# Patient Record
Sex: Male | Born: 1970 | Race: Asian | Hispanic: No | Marital: Single | Smoking: Never smoker
Health system: Southern US, Community
[De-identification: ages and names within clinical notes are randomized; demographics above are authoritative.]

---

## 1998-07-29 HISTORY — PX: RETINAL LASER PROCEDURE: SHX2339

## 2011-03-30 ENCOUNTER — Ambulatory Visit: Payer: Self-pay | Admitting: Internal Medicine

## 2013-06-26 ENCOUNTER — Ambulatory Visit: Payer: Self-pay

## 2014-06-21 ENCOUNTER — Ambulatory Visit: Payer: Self-pay

## 2016-07-29 HISTORY — PX: CATARACT EXTRACTION: SUR2

## 2019-06-21 ENCOUNTER — Ambulatory Visit (INDEPENDENT_AMBULATORY_CARE_PROVIDER_SITE_OTHER): Payer: 59 | Admitting: Family Medicine

## 2019-06-21 ENCOUNTER — Encounter: Payer: Self-pay | Admitting: Family Medicine

## 2019-06-21 ENCOUNTER — Other Ambulatory Visit: Payer: Self-pay

## 2019-06-21 VITALS — BP 137/86 | HR 78 | Temp 98.5°F | Resp 16 | Ht 66.0 in | Wt 182.0 lb

## 2019-06-21 DIAGNOSIS — Z0000 Encounter for general adult medical examination without abnormal findings: Secondary | ICD-10-CM | POA: Diagnosis not present

## 2019-06-21 DIAGNOSIS — Z125 Encounter for screening for malignant neoplasm of prostate: Secondary | ICD-10-CM

## 2019-06-21 DIAGNOSIS — Z7689 Persons encountering health services in other specified circumstances: Secondary | ICD-10-CM | POA: Diagnosis not present

## 2019-06-21 DIAGNOSIS — E663 Overweight: Secondary | ICD-10-CM

## 2019-06-21 DIAGNOSIS — R030 Elevated blood-pressure reading, without diagnosis of hypertension: Secondary | ICD-10-CM | POA: Insufficient documentation

## 2019-06-21 DIAGNOSIS — Z1211 Encounter for screening for malignant neoplasm of colon: Secondary | ICD-10-CM

## 2019-06-21 DIAGNOSIS — E66811 Obesity, class 1: Secondary | ICD-10-CM | POA: Insufficient documentation

## 2019-06-21 NOTE — Patient Instructions (Addendum)
Thank you for coming to the office today.  Labs today - stay tuned for results.  For Lab Results, once available within 2-3 days of blood draw, you can can log in to MyChart online to view your results and a brief explanation. Also, we can discuss results at next follow-up visit.  Colon Cancer Screening: - For all adults age 46+ routine colon cancer screening is highly recommended.     - Recent guidelines from [place] recommend starting age of 48 - Early detection of colon cancer is important, because often there are no warning signs or symptoms, also if found early usually it can be cured. Late stage is hard to treat.  - If you are not interested in Colonoscopy screening (if done and normal you could be cleared for 5 to 10 years until next due), then Cologuard is an excellent alternative for screening test for Colon Cancer. It is highly sensitive for detecting DNA of colon cancer from even the earliest stages. Also, there is NO bowel prep required. - If Cologuard is NEGATIVE, then it is good for 3 years before next due - If Cologuard is POSITIVE, then it is strongly advised to get a Colonoscopy, which allows the GI doctor to locate the source of the cancer or polyp (even very early stage) and treat it by removing it. ------------------------- If you would like to proceed with Cologuard (stool DNA test) Follow instructions to collect sample, you may call the company for any help or questions, 24/7 telephone support at [phone].   Please schedule a Follow-up Appointment to: Return in about 1 year (around 06/20/2020) for Annual Physical.  If you have any other questions or concerns, please feel free to call the office or send a message through [place]. You may also schedule an earlier appointment if necessary.  Additionally, you may be receiving a survey about your experience at our office within a few days to 1 week by e-mail or mail. We value your feedback.  Nobie Putnam, DO [place]

## 2019-06-21 NOTE — Progress Notes (Signed)
Subjective:    Patient ID: Maxwell Cooper, male    DOB: February 28, 1971, 48 y.o.   MRN: [id number]  Maxwell Cooper is a 48 y.o. male presenting on 06/21/2019 for Annual Exam (establish care as well today)  Establishing with new doctor now for routine blood work and physical.  HPI   Here for Annual Physical and Lab Review.  Overweight BMI >29 He is doing well overall No new significant medical concerns. No significant medical history in past related to general health. He has not had elevated cholesterol, blood sugar. He has had occasional mild elevated BP but never dx with HTN.  Additional complaint Admits episodic rare R hip pain episodic- with some possible pinched nerve. No new injury, seems to be episodic only  Health Maintenance: Colon CA Screening: Never had colon cancer screening. Currently asymptomatic. No known family history of colon CA. Due for screening test considering Cologuard, counseling given  Prostate CA Screening: No prior prostate CA screening. Currently asymptomatic. No known family history of prostate CA. Due for screening.    [facility] 06/21/2019  Decreased Interest 0 0  Down, Depressed, Hopeless 0 0  PHQ - 2 Score 0 0    History reviewed. No pertinent past medical history. Past Surgical History:  Procedure Laterality Date  . CATARACT EXTRACTION    . RETINAL LASER PROCEDURE     Social History   Socioeconomic History  . Marital status: Married    Spouse name: Not on file  . Number of children: Not on file  . Years of education: Not on file  . Highest education level: Not on file  Occupational History  . Not on file  Social Needs  . Financial resource strain: Not on file  . Food insecurity    Worry: Not on file    Inability: Not on file  . Transportation needs    Medical: Not on file    Non-medical: Not on file  Tobacco Use  . Smoking status: Never Smoker  . Smokeless tobacco: Never Used  Substance and Sexual Activity   . Alcohol use: Yes  . Drug use: Never  . Sexual activity: Not on file  Lifestyle  . Physical activity    Days per week: Not on file    Minutes per session: Not on file  . Stress: Not on file  Relationships  . Social Musicianconnections    Talks on phone: Not on file    Gets together: Not on file    Attends religious service: Not on file    Active member of club or organization: Not on file    Attends meetings of clubs or organizations: Not on file    Relationship status: Not on file  . Intimate partner violence    Fear of current or ex partner: Not on file    Emotionally abused: Not on file    Physically abused: Not on file    Forced sexual activity: Not on file  Other Topics Concern  . Not on file  Social History Narrative  . Not on file   Family History  Problem Relation Age of Onset  . Hypertension Mother   . Dementia Mother   . Lung cancer Paternal Grandfather        smoking  . Colon cancer Maternal Grandmother   . Prostate cancer Neg Hx    No current outpatient medications on file prior to visit.   No current facility-administered medications on file prior to visit.  Review of Systems  Constitutional: Negative for activity change, appetite change, chills, diaphoresis, fatigue and fever.  HENT: Negative for congestion and hearing loss.   Eyes: Negative for visual disturbance.  Respiratory: Negative for apnea, cough, chest tightness, shortness of breath and wheezing.   Cardiovascular: Negative for chest pain, palpitations and leg swelling.  Gastrointestinal: Negative for abdominal pain, anal bleeding, blood in stool, constipation, diarrhea, nausea and vomiting.  Endocrine: Negative for cold intolerance.  Genitourinary: Negative for difficulty urinating, dysuria, frequency and hematuria.  Musculoskeletal: Negative for arthralgias, back pain and neck pain.       R hip area episodic pain  Skin: Negative for rash.  Allergic/Immunologic: Negative for environmental  allergies.  Neurological: Negative for dizziness, weakness, light-headedness, numbness and headaches.  Hematological: Negative for adenopathy.  Psychiatric/Behavioral: Negative for behavioral problems, dysphoric mood and sleep disturbance. The patient is not nervous/anxious.    Per HPI unless specifically indicated above      Objective:    BP 137/86   Pulse 78   Temp 98.5 F (36.9 C) (Oral)   Resp 16   Ht 5\' 6"  (1.676 m)   Wt 182 lb (82.6 kg)   BMI 29.38 kg/m   Wt Readings from Last 3 Encounters:  06/21/19 182 lb (82.6 kg)    Physical Exam Vitals signs and nursing note reviewed.  Constitutional:      General: He is not in acute distress.    Appearance: He is well-developed. He is not diaphoretic.     Comments: Well-appearing, comfortable, cooperative  HENT:     Head: Normocephalic and atraumatic.  Eyes:     General:        Right eye: No discharge.        Left eye: No discharge.     Conjunctiva/sclera: Conjunctivae normal.     Pupils: Pupils are equal, round, and reactive to light.  Neck:     Musculoskeletal: Normal range of motion and neck supple.     Thyroid: No thyromegaly.  Cardiovascular:     Rate and Rhythm: Normal rate and regular rhythm.     Heart sounds: Normal heart sounds. No murmur.  Pulmonary:     Effort: Pulmonary effort is normal. No respiratory distress.     Breath sounds: Normal breath sounds. No wheezing or rales.  Abdominal:     General: Bowel sounds are normal. There is no distension.     Palpations: Abdomen is soft. There is no mass.     Tenderness: There is no abdominal tenderness.  Musculoskeletal: Normal range of motion.        General: No tenderness.     Comments: Upper / Lower Extremities: - Normal muscle tone, strength bilateral upper extremities 5/5, lower extremities 5/5  Area of non reproducible discomfort lower abdomen over iliac crest area.  Lymphadenopathy:     Cervical: No cervical adenopathy.  Skin:    General: Skin is warm  and dry.     Findings: No erythema or rash.  Neurological:     Mental Status: He is alert and oriented to person, place, and time.     Comments: Distal sensation intact to light touch all extremities  Psychiatric:        Behavior: Behavior normal.     Comments: Well groomed, good eye contact, normal speech and thoughts    No results found for this or any previous visit.    Assessment & Plan:   Problem List Items Addressed This Visit    Overweight (  BMI 25.0-29.9)   Elevated BP without diagnosis of hypertension    Other Visit Diagnoses    Annual physical exam    -  Primary   Relevant Orders   [facility] - A1c LAB Hemoglobin A1C physical   [facility] - CBC with Differential/Platelet physical   [facility] - CMET w/ GFR CMP Complete Metabolic Panel physical   [facility] - Lipid panel physical   [facility] - PSA physical   Encounter to establish care with new doctor       Screening for prostate cancer       Screening for colon cancer       Relevant Orders   [facility] - Cologuard      Updated Health Maintenance information Reviewed recent lab results with patient Encouraged improvement to lifestyle with diet and exercise - Goal of maintain healthy weight  Due for routine colon cancer screening.  - Discussion today about recommendations for either Colonoscopy or Cologuard screening, benefits and risks of screening, interested in Cologuard, understands that if positive then recommendation is for diagnostic colonoscopy to follow-up. - Ordered Cologuard today / Patient advised to contact insurance to learn cost  #BP mild elevated - monitor BP, can check outside office, return sooner if need  No orders of the defined types were placed in this encounter.   Follow up plan: Return in about 1 year (around 06/20/2020) for Annual Physical.   Saralyn Pilar, DO [facility] 06/21/2019, 10:19 AM

## 2019-06-22 LAB — LIPID PANEL
Cholesterol: 229 mg/dL — ABNORMAL HIGH (ref <200)
HDL: 62 mg/dL (ref >=40)
LDL Cholesterol (Calc): 146 mg/dL (calc) — ABNORMAL HIGH
Non-HDL Cholesterol (Calc): 167 mg/dL (calc) — ABNORMAL HIGH (ref <130)
Total CHOL/HDL Ratio: 3.7 (calc) (ref <5.0)
Triglycerides: 100 mg/dL (ref <150)

## 2019-06-22 LAB — CBC WITH DIFFERENTIAL/PLATELET
Absolute Monocytes: 281 cells/uL (ref 200–950)
Basophils Absolute: 59 cells/uL (ref 0–200)
Basophils Relative: 1.4 %
Eosinophils Absolute: 252 cells/uL (ref 15–500)
Eosinophils Relative: 6 %
HCT: 46.2 % (ref 38.5–50.0)
Hemoglobin: 16.1 g/dL (ref 13.2–17.1)
Lymphs Abs: 1411 cells/uL (ref 850–3900)
MCH: 32.7 pg (ref 27.0–33.0)
MCHC: 34.8 g/dL (ref 32.0–36.0)
MCV: 93.7 fL (ref 80.0–100.0)
MPV: 11 fL (ref 7.5–12.5)
Monocytes Relative: 6.7 %
Neutro Abs: 2197 cells/uL (ref 1500–7800)
Neutrophils Relative %: 52.3 %
Platelets: 232 10*3/uL (ref 140–400)
RBC: 4.93 10*6/uL (ref 4.20–5.80)
RDW: 12.4 % (ref 11.0–15.0)
Total Lymphocyte: 33.6 %
WBC: 4.2 10*3/uL (ref 3.8–10.8)

## 2019-06-22 LAB — HEMOGLOBIN A1C
Hgb A1c MFr Bld: 5.8 % of total Hgb — ABNORMAL HIGH (ref <5.7)
Mean Plasma Glucose: 120 (calc)
eAG (mmol/L): 6.6 (calc)

## 2019-06-22 LAB — COMPLETE METABOLIC PANEL WITH GFR
AG Ratio: 2.1 (calc) (ref 1.0–2.5)
ALT: 32 U/L (ref 9–46)
AST: 28 U/L (ref 10–40)
Albumin: 5 g/dL (ref 3.6–5.1)
Alkaline phosphatase (APISO): 51 U/L (ref 36–130)
BUN: 19 mg/dL (ref 7–25)
CO2: 26 mmol/L (ref 20–32)
Calcium: 10 mg/dL (ref 8.6–10.3)
Chloride: 102 mmol/L (ref 98–110)
Creat: 0.96 mg/dL (ref 0.60–1.35)
GFR, Est African American: 109 mL/min/{1.73_m2} (ref >=60)
GFR, Est Non African American: 94 mL/min/{1.73_m2} (ref >=60)
Globulin: 2.4 g/dL (calc) (ref 1.9–3.7)
Glucose, Bld: 110 mg/dL — ABNORMAL HIGH (ref 65–99)
Potassium: 4.4 mmol/L (ref 3.5–5.3)
Sodium: 139 mmol/L (ref 135–146)
Total Bilirubin: 0.7 mg/dL (ref 0.2–1.2)
Total Protein: 7.4 g/dL (ref 6.1–8.1)

## 2019-06-22 LAB — PSA: PSA: 0.3 ng/mL (ref <=4.0)

## 2019-06-27 ENCOUNTER — Encounter: Payer: Self-pay | Admitting: Family Medicine

## 2019-06-27 DIAGNOSIS — E7800 Pure hypercholesterolemia, unspecified: Secondary | ICD-10-CM | POA: Insufficient documentation

## 2019-06-27 DIAGNOSIS — R7309 Other abnormal glucose: Secondary | ICD-10-CM | POA: Insufficient documentation

## 2019-10-04 ENCOUNTER — Ambulatory Visit (INDEPENDENT_AMBULATORY_CARE_PROVIDER_SITE_OTHER): Payer: 59 | Admitting: Family Medicine

## 2019-10-04 ENCOUNTER — Encounter: Payer: Self-pay | Admitting: Family Medicine

## 2019-10-04 ENCOUNTER — Other Ambulatory Visit: Payer: Self-pay

## 2019-10-04 VITALS — BP 135/83 | HR 81 | Temp 97.8°F | Resp 16 | Ht 66.0 in | Wt 187.0 lb

## 2019-10-04 DIAGNOSIS — N50812 Left testicular pain: Secondary | ICD-10-CM

## 2019-10-04 NOTE — Progress Notes (Signed)
Subjective:    Patient ID: Maxwell Cooper, male    DOB: 1970-12-28, 49 y.o.   MRN: [id number]  Maxwell Cooper is a 49 y.o. male presenting on 10/04/2019 for Groin Pain (Left side onset 2 month but as per patient getting worst now )   HPI   LEFT Testicular / Groin Pain Reports acute onset 2 months ago Left groin pressure pain, he was resting and sitting on sofa, not doing any strenuous activity, seems to be worse if prolong sitting and also feels it if standing, doesn't seem to be affected by lifting. Seems to be left testicular region and some radiating pain into left groin muscle thigh. Pain is not worse with touching the area. Tried Tylenol PRN some temporary relief. - Admits occasional nausea if pain increases - Denies any vomiting, difficulty urinating, hematuria, constipation or straining, swelling, redness rash, lifting injury   Depression screen [facility] 2/9 06/21/2019 06/21/2019  Decreased Interest 0 0  Down, Depressed, Hopeless 0 0  PHQ - 2 Score 0 0   Past Surgical History:  Procedure Laterality Date  . CATARACT EXTRACTION  2018  . RETINAL LASER PROCEDURE  2000    Social History   Tobacco Use  . Smoking status: Never Smoker  . Smokeless tobacco: Never Used  Substance Use Topics  . Alcohol use: Yes  . Drug use: Never    Review of Systems Per HPI unless specifically indicated above     Objective:    BP 135/83   Pulse 81   Temp 97.8 F (36.6 C) (Temporal)   Resp 16   Ht 5\' 6"  (1.676 m)   Wt 187 lb (84.8 kg)   BMI 30.18 kg/m   Wt Readings from Last 3 Encounters:  10/04/19 187 lb (84.8 kg)  06/21/19 182 lb (82.6 kg)    Physical Exam Vitals and nursing note reviewed.  Constitutional:      General: He is not in acute distress.    Appearance: He is well-developed. He is not diaphoretic.     Comments: Well-appearing, comfortable, cooperative  HENT:     Head: Normocephalic and atraumatic.  Eyes:     General:        Right eye: No discharge.        Left eye: No  discharge.     Conjunctiva/sclera: Conjunctivae normal.  Cardiovascular:     Rate and Rhythm: Normal rate.  Pulmonary:     Effort: Pulmonary effort is normal.  Abdominal:     Palpations: Abdomen is soft. There is no mass.  Genitourinary:    Comments: Normal external genitalia. Scrotum is normal without edema or erythema or lesions. Non tender on exam. No mass or localized abnormality of Left testicle or spermatic cord.  No obvious provoked inguinal hernia palpated and on valsalva on exam. Skin:    General: Skin is warm and dry.     Findings: No erythema or rash.  Neurological:     Mental Status: He is alert and oriented to person, place, and time.  Psychiatric:        Behavior: Behavior normal.     Comments: Well groomed, good eye contact, normal speech and thoughts       Results for orders placed or performed in visit on 06/21/19  [facility] - A1c LAB Hemoglobin A1C physical  Result Value Ref Range   Hgb A1c MFr Bld 5.8 (H) <5.7 % of total Hgb   Mean Plasma Glucose 120 (calc)   eAG (mmol/L) 6.6 (calc)  [facility] - CBC with Differential/Platelet physical  Result Value Ref Range   WBC 4.2 3.8 - 10.8 Thousand/uL   RBC 4.93 4.20 - 5.80 Million/uL   Hemoglobin 16.1 13.2 - 17.1 g/dL   HCT 46.2 38.5 - 50.0 %   MCV 93.7 80.0 - 100.0 fL   MCH 32.7 27.0 - 33.0 pg   MCHC 34.8 32.0 - 36.0 g/dL   RDW 12.4 11.0 - 15.0 %   Platelets 232 140 - 400 Thousand/uL   MPV 11.0 7.5 - 12.5 fL   Neutro Abs 2,197 1,500 - 7,800 cells/uL   Lymphs Abs 1,411 850 - 3,900 cells/uL   Absolute Monocytes 281 200 - 950 cells/uL   Eosinophils Absolute 252 15 - 500 cells/uL   Basophils Absolute 59 0 - 200 cells/uL   Neutrophils Relative % 52.3 %   Total Lymphocyte 33.6 %   Monocytes Relative 6.7 %   Eosinophils Relative 6.0 %   Basophils Relative 1.4 %  [facility] - CMET w/ GFR CMP Complete Metabolic Panel physical  Result Value Ref Range   Glucose, Bld 110 (H) 65 - 99 mg/dL   BUN 19 7 - 25 mg/dL   Creat 0.96 0.60  - 1.35 mg/dL   GFR, Est Non African American 94 > OR = 60 mL/min/1.43m2   GFR, Est African American 109 > OR = 60 mL/min/1.84m2   BUN/Creatinine Ratio NOT APPLICABLE 6 - 22 (calc)   Sodium 139 135 - 146 mmol/L   Potassium 4.4 3.5 - 5.3 mmol/L   Chloride 102 98 - 110 mmol/L   CO2 26 20 - 32 mmol/L   Calcium 10.0 8.6 - 10.3 mg/dL   Total Protein 7.4 6.1 - 8.1 g/dL   Albumin 5.0 3.6 - 5.1 g/dL   Globulin 2.4 1.9 - 3.7 g/dL (calc)   AG Ratio 2.1 1.0 - 2.5 (calc)   Total Bilirubin 0.7 0.2 - 1.2 mg/dL   Alkaline phosphatase (APISO) 51 36 - 130 U/L   AST 28 10 - 40 U/L   ALT 32 9 - 46 U/L  [facility] - Lipid panel physical  Result Value Ref Range   Cholesterol 229 (H) <200 mg/dL   HDL 62 > OR = 40 mg/dL   Triglycerides 100 <150 mg/dL   LDL Cholesterol (Calc) 146 (H) mg/dL (calc)   Total CHOL/HDL Ratio 3.7 <5.0 (calc)   Non-HDL Cholesterol (Calc) 167 (H) <130 mg/dL (calc)  [facility] - PSA physical  Result Value Ref Range   PSA 0.3 < OR = 4.0 ng/mL      Assessment & Plan:   Problem List Items Addressed This Visit    None    Visit Diagnoses    Testicular pain, left    -  Primary   Relevant Orders   US SCROTUM W/DOPPLER   Ambulatory referral to Urology      Clinically with undetermined Left sided testicular pain with some radiation inguinal/groin area, seems to be gradually worsening and persistent, rather than provoked only situational, worse with prolong sitting and even standing. No obvious heavy lifting or MSK injury or hernia evident on history or exam. - No abnormal testicular findings. No mass appreciated or other abnormality  Patient voices concern with friend who was dx with cancer in past, and he wants to be sure that this is not cancer.  Will pursue scrotum ultrasound for evaluation of his testicular pain and then refer to Urology [facility] for consultation after ultrasound is completed.  May use Tylenol / NSAID  PRN, ice, avoid heavy lifting provoking activity.  AVS instructions  given.  Orders Placed This Encounter  Procedures  . US SCROTUM W/DOPPLER    Standing Status:   Future    Standing Expiration Date:   12/03/2020    Order Specific Question:   Reason for Exam (SYMPTOM  OR DIAGNOSIS REQUIRED)    Answer:   left testicular pain over 2 months worsening, some radiation into inguinal area, no obvious hernia, no mass palpated    Order Specific Question:   Preferred imaging location?    Answer:   [facility]  . Ambulatory referral to Urology    Referral Priority:   Routine    Referral Type:   Consultation    Referral Reason:   Specialty Services Required    Requested Specialty:   Urology    Number of Visits Requested:   1     No orders of the defined types were placed in this encounter.   Follow up plan: Return in about 4 weeks (around 11/01/2019), or if symptoms worsen or fail to improve, for testicular pain.   Saralyn Pilar, DO [facility] 10/04/2019, 2:54 PM

## 2019-10-04 NOTE — Patient Instructions (Addendum)
Thank you for coming to the office today.  I don't think it is a cancer based on what you are describing and my exam. Possible to still be a hernia but I don't feel this today so we will start with a testicular ultrasound.  We will order a Scrotal (Testicular) Ultrasound picture first, then you can speak with the specialist about the results  For imaging you can go to main [facility] entrance.  Address: [address] [place], [place], [place] [address] Phone: [phone]  If you call - ask for Ultrasound / Radiology and where to go  -----------  Referral to Urologist specialist  [facility] -1st floor [address] [place],  [place]  [address] Phone: [phone]   Please schedule a Follow-up Appointment to: Return in about 4 weeks (around 11/01/2019), or if symptoms worsen or fail to improve, for testicular pain.  If you have any other questions or concerns, please feel free to call the office or send a message through MyChart. You may also schedule an earlier appointment if necessary.  Additionally, you may be receiving a survey about your experience at our office within a few days to 1 week by e-mail or mail. We value your feedback.  Saralyn Pilar, DO [facility], [place]

## 2019-10-07 ENCOUNTER — Other Ambulatory Visit: Payer: Self-pay

## 2019-10-07 ENCOUNTER — Ambulatory Visit
Admission: RE | Admit: 2019-10-07 | Discharge: 2019-10-07 | Disposition: A | Discharge status: Home / Self Care | Payer: 59 | Source: Ambulatory Visit | Attending: Family Medicine | Admitting: Family Medicine

## 2019-10-07 DIAGNOSIS — N50812 Left testicular pain: Secondary | ICD-10-CM | POA: Insufficient documentation

## 2019-10-15 ENCOUNTER — Ambulatory Visit: Payer: 59 | Admitting: Urology

## 2019-11-09 ENCOUNTER — Ambulatory Visit: Payer: 59 | Admitting: Urology

## 2020-11-29 IMAGING — US US SCROTUM W/ DOPPLER COMPLETE
1 series · 14 of 25 positions shown · non-contrast
Comparison: None.

CLINICAL DATA: Left testicle pain

EXAM:
SCROTAL ULTRASOUND
DOPPLER ULTRASOUND OF THE TESTICLES
TECHNIQUE: Complete ultrasound examination of the testicles, epididymis, and
other scrotal structures was performed. Color and spectral Doppler
ultrasound were also utilized to evaluate blood flow to the
testicles.

[Series 1: us scrotum w/doppler · 14 of 32 slices shown]
[im 1/32]
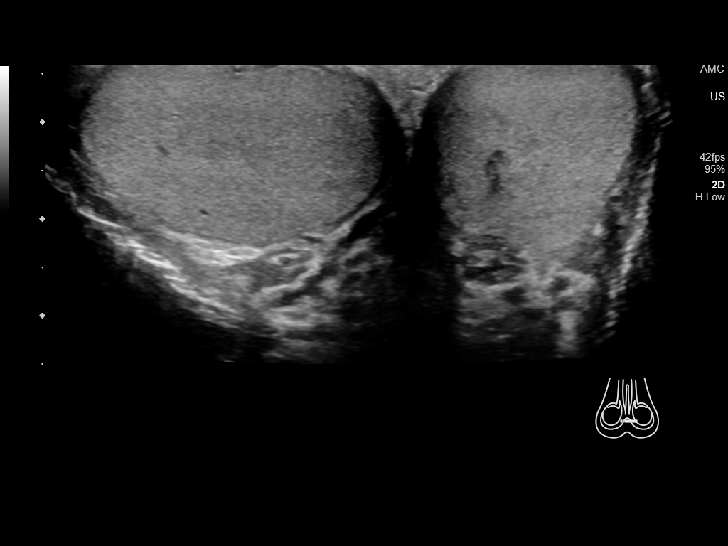
[im 3/32]
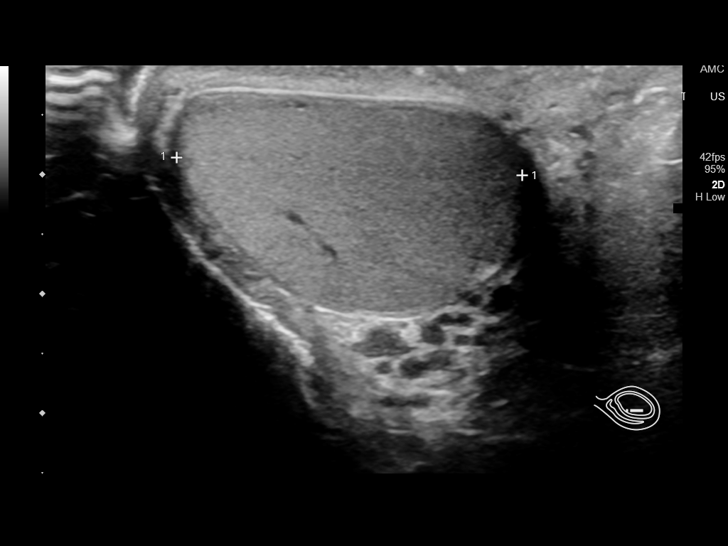
[im 6/32]
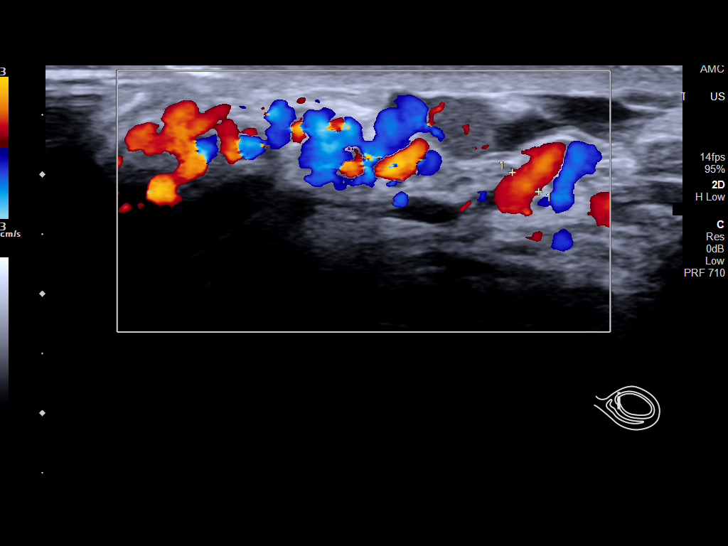
[im 8/32]
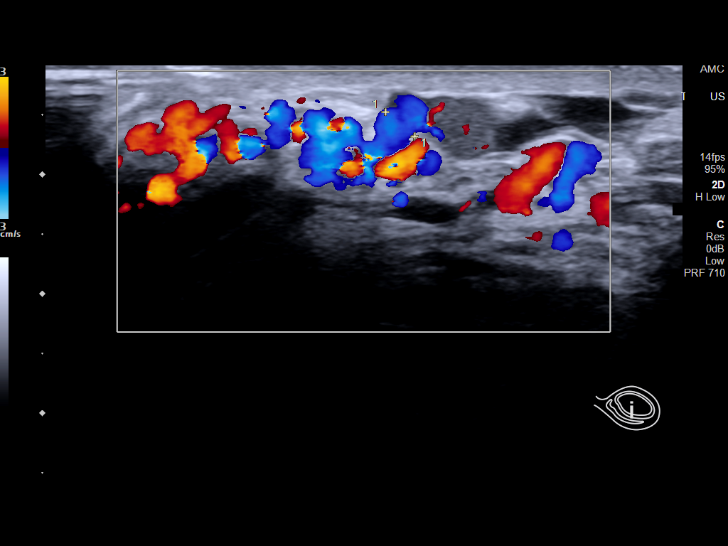
[im 11/32]
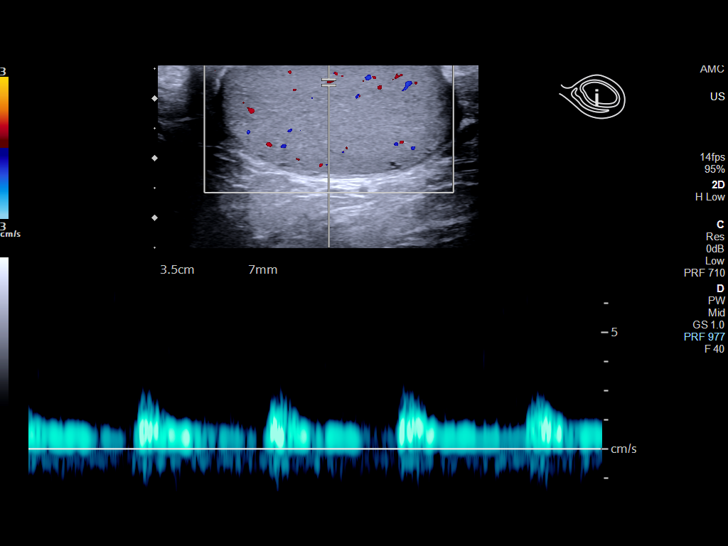
[im 12/32]
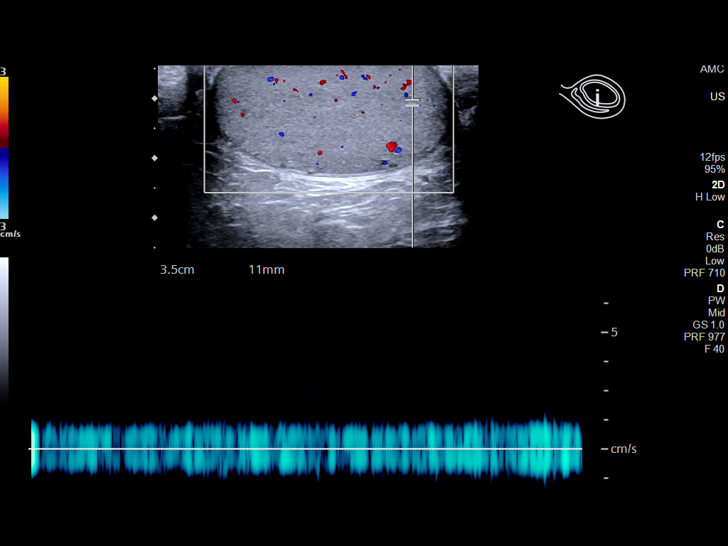
[im 15/32]
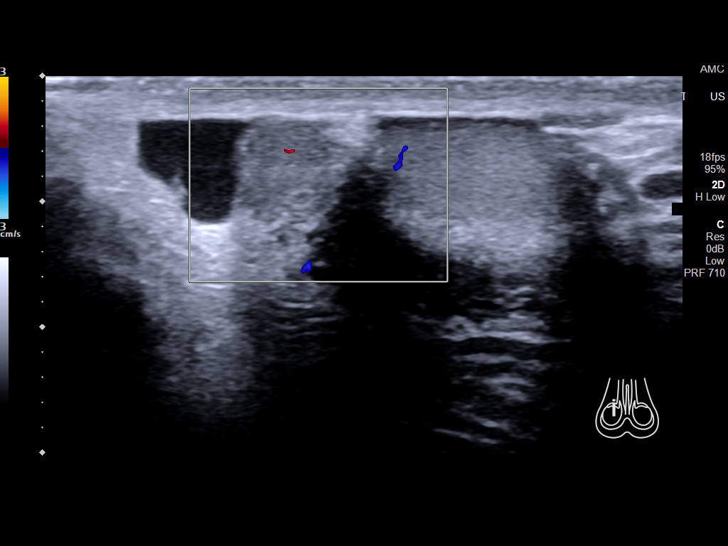
[im 17/32]
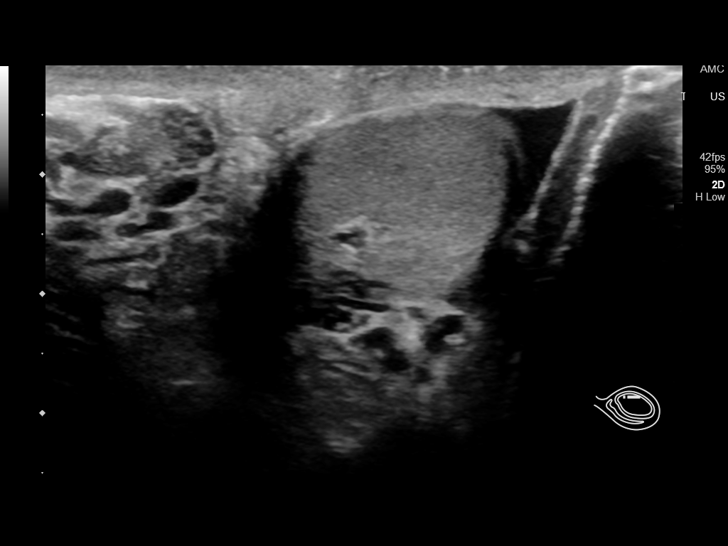
[im 20/32]
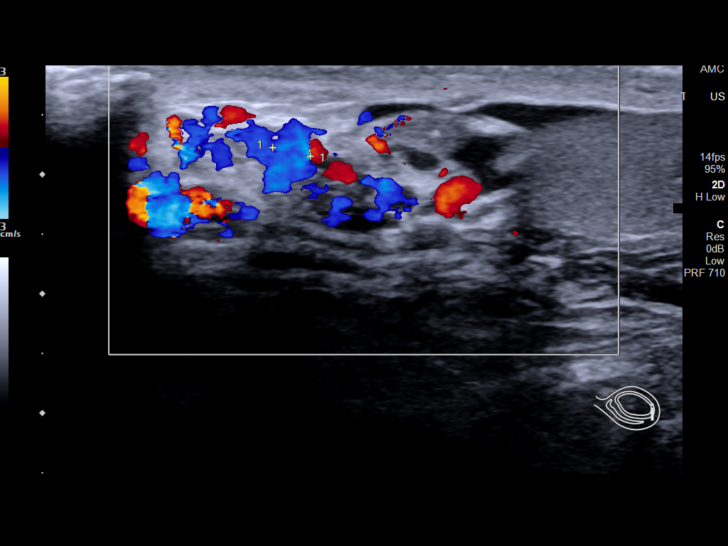
[im 21/32]
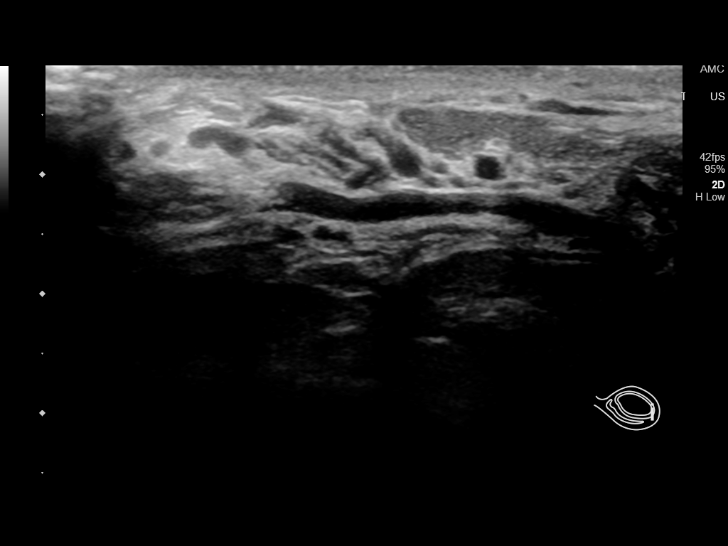
[im 24/32]
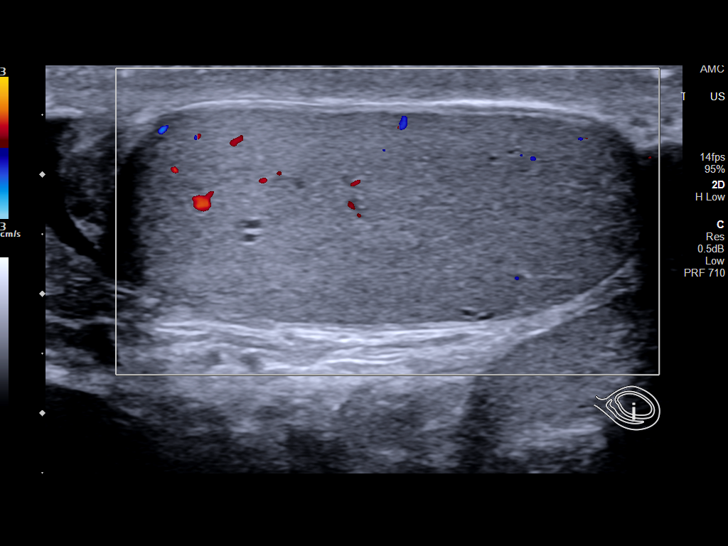
[im 26/32]
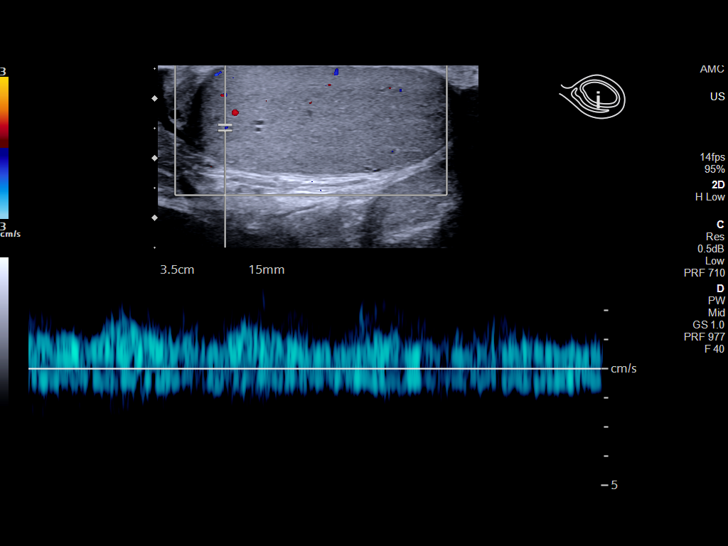
[im 29/32]
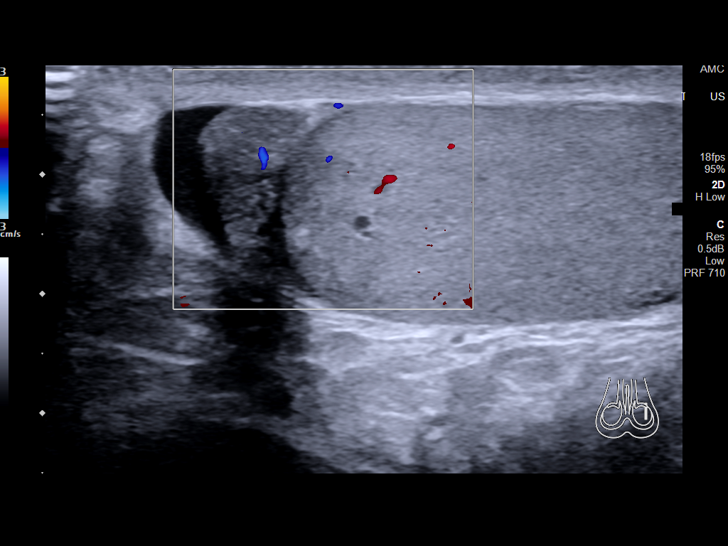
[im 32/32]
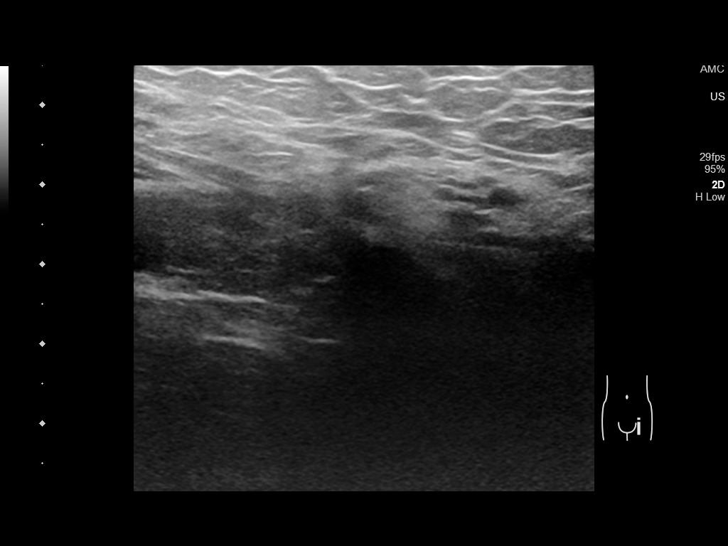

[14 of 25 positions shown; findings below may reference images not displayed]

FINDINGS: Right testicle

Measurements: 4 x 1.9 x 2.9 cm. No mass or microlithiasis
visualized.

Left testicle

Measurements: 4.4 x 1.9 x 2.5 cm. No mass or microlithiasis
visualized.

Right epididymis:  Normal in size and appearance.

Left epididymis:  Normal in size and appearance.

Hydrocele:  None visualized.

Varicocele:  Small bilateral varicoceles.

Pulsed Doppler interrogation of both testes demonstrates normal low
resistance arterial and venous waveforms bilaterally.
IMPRESSION: 1. Negative for testicular torsion or testicular mass lesion.
2. Bilateral varicocele

## 2021-02-27 ENCOUNTER — Encounter: Payer: 59 | Admitting: Family Medicine

## 2021-04-03 ENCOUNTER — Telehealth: Payer: Self-pay

## 2021-04-03 ENCOUNTER — Telehealth: Payer: 59 | Admitting: Physician Assistant

## 2021-04-03 ENCOUNTER — Other Ambulatory Visit: Payer: Self-pay

## 2021-04-03 DIAGNOSIS — U071 COVID-19: Secondary | ICD-10-CM

## 2021-04-03 MED ORDER — BENZONATATE 100 MG PO CAPS: 100.0000 mg | ORAL_CAPSULE | Freq: Three times a day (TID) | ORAL | 0 refills | Status: DC | PRN

## 2021-04-03 NOTE — Progress Notes (Signed)

## 2021-04-03 NOTE — Telephone Encounter (Signed)
Patient called back, states was unable to get medication from pharmacy. He says he was told couldn't get the med for covid because they needed a GFR. Please call back

## 2021-04-03 NOTE — Telephone Encounter (Addendum)
Please contact patient and notify them that they can proceed w/ speaking directly to pharmacist through [facility] to determine if they meet criteria to start the anti-viral medication. No appointment should be necessary. We don't have any available apt today regardless.  They should call a [facility] and state that his physician has referred them to speak to them for COVID Anti viral treatment.  The pharmacist will discuss the criteria and review symptoms and if meets criteria, they can prescribe it for them.  [facility] at [facility] [phone], [address] [place], [place] [address]  [facility] at [facility] [phone], [address], [address], [place], [place] [address]  Saralyn Pilar, DO [facility] 03/28/2021, 10:04 AM

## 2021-04-03 NOTE — Telephone Encounter (Signed)
Pt advised to call the outpatient pharmacy for assistance.

## 2021-04-03 NOTE — Telephone Encounter (Signed)
BPA triggered for worsening symptoms diarrhea. Patient called, left VM to return the call to [phone] to speak to a nurse about symptoms.

## 2021-04-03 NOTE — Telephone Encounter (Signed)
Pt. Called back in regard to COVID 19 questionnaire. Did discuss his symptoms during his e-visit. Would like to have an anti-viral sent to his pharmacy. Please advise pt.

## 2021-04-03 NOTE — Progress Notes (Signed)
I have spent 5 minutes in review of e-visit questionnaire, review and updating patient chart, medical decision making and response to patient.   Solash Tullo Cody Quetzally Callas, PA-C    

## 2021-04-04 ENCOUNTER — Other Ambulatory Visit: Payer: Self-pay

## 2021-04-04 ENCOUNTER — Other Ambulatory Visit: Payer: Self-pay | Admitting: Family Medicine

## 2021-04-04 DIAGNOSIS — U071 COVID-19: Secondary | ICD-10-CM

## 2021-04-04 MED ORDER — MOLNUPIRAVIR EUA 200MG CAPSULE: 4.0000 | ORAL_CAPSULE | Freq: Two times a day (BID) | ORAL | 0 refills | Status: DC

## 2021-04-04 NOTE — Telephone Encounter (Signed)
Called pharmacy, they cannot order Paxlovid without GFR, last test result 05/2019.  I called patient, we opted to avoid a STAT lab in office for covid positive patient under 5 days onset symptoms - his symptoms are mild and improved today, he is on Day 3, we agreed to order Molnupiravir instead as a back up plan to his [facility] pharmacy he may take it if not improved by Day 5, reviewed prescribing precautions and risk on this medication regarding pregnancy complication. He is aware and okay to take it if needed  Follow up as advised  Saralyn Pilar, DO [facility] 04/04/2021, 12:12 PM

## 2021-04-05 ENCOUNTER — Encounter: Payer: Self-pay | Admitting: Family Medicine

## 2021-04-05 ENCOUNTER — Other Ambulatory Visit: Payer: Self-pay

## 2021-04-05 DIAGNOSIS — U071 COVID-19: Secondary | ICD-10-CM

## 2021-04-05 MED ORDER — MOLNUPIRAVIR EUA 200MG CAPSULE: 4.0000 | ORAL_CAPSULE | Freq: Two times a day (BID) | ORAL | 0 refills | Status: AC

## 2021-04-05 NOTE — Telephone Encounter (Signed)
Spoke with the patient on the phone just now and I resent the medication to [facility] in [place] again.

## 2021-12-03 ENCOUNTER — Encounter: Payer: Self-pay | Admitting: Family Medicine

## 2021-12-03 ENCOUNTER — Ambulatory Visit: Payer: 59 | Admitting: Family Medicine

## 2021-12-03 ENCOUNTER — Ambulatory Visit (INDEPENDENT_AMBULATORY_CARE_PROVIDER_SITE_OTHER): Payer: Self-pay | Admitting: Family Medicine

## 2021-12-03 VITALS — BP 116/80 | HR 68 | Ht 67.0 in | Wt 186.2 lb

## 2021-12-03 DIAGNOSIS — J3089 Other allergic rhinitis: Secondary | ICD-10-CM

## 2021-12-03 DIAGNOSIS — R1310 Dysphagia, unspecified: Secondary | ICD-10-CM

## 2021-12-03 DIAGNOSIS — J4530 Mild persistent asthma, uncomplicated: Secondary | ICD-10-CM

## 2021-12-03 MED ORDER — FLUTICASONE-SALMETEROL 250-50 MCG/ACT IN AEPB: 1.0000 | INHALATION_SPRAY | Freq: Two times a day (BID) | RESPIRATORY_TRACT | 2 refills | Status: DC

## 2021-12-03 NOTE — Progress Notes (Signed)
? ?Subjective:  ? ? Patient ID: Maxwell Cooper, male    DOB: Sep 20, 1970, 51 y.o.   MRN: [id number] ? ?Maxwell Cooper is a 51 y.o. male presenting on 12/03/2021 for Asthma ? ?Last visit 2021 ? ?HPI ? ?Dysphagia / Globus Sensation ?Asthma, mild persistent ?Seasonal Allergies ? ?He had history of COVID in 2022 but breathing was okay. He says that he did not have  ? ?Usually only when he eats he has difficulty swallowing at times if eating larger harder to chew bites and also even with drinking liquids. ? ?He does not endorse heartburn or throat pain. But it feels like the food is being stuck. ? ?Admits some coughing spells at times. He admits some mild asthma spells in morning but not always using inhaler, usually worse in night-time. May use night-time inhaler albuterol. ? ?Denies any nausea or vomiting or regurgitation. ? ?Admits difficulty with snoring at night. He said high cost for CPAP machine not insured so he could not pursue that. He now uses a mouth piece to help his breathing at night. Occasional wake up. ? ? ? ?  12/03/2021  ? 11:24 AM 06/21/2019  ? 10:01 AM 06/21/2019  ?  9:57 AM  ?Depression screen PHQ 2/9  ?Decreased Interest 0 0 0  ?Down, Depressed, Hopeless 0 0 0  ?PHQ - 2 Score 0 0 0  ? ? ?Social History  ? ?Tobacco Use  ? Smoking status: Never  ? Smokeless tobacco: Never  ?Substance Use Topics  ? Alcohol use: Yes  ? Drug use: Never  ? ? ?Review of Systems ?Per HPI unless specifically indicated above ? ?   ?Objective:  ?  ?BP 116/80   Pulse 68   Ht 5\' 7"  (1.702 m)   Wt 186 lb 3.2 oz (84.5 kg)   SpO2 99%   BMI 29.16 kg/m?   ?Wt Readings from Last 3 Encounters:  ?12/03/21 186 lb 3.2 oz (84.5 kg)  ?10/04/19 187 lb (84.8 kg)  ?06/21/19 182 lb (82.6 kg)  ?  ?Physical Exam ?Vitals and nursing note reviewed.  ?Constitutional:   ?   General: He is not in acute distress. ?   Appearance: Normal appearance. He is well-developed. He is not diaphoretic.  ?   Comments: Well-appearing, comfortable, cooperative  ?HENT:  ?    Head: Normocephalic and atraumatic.  ?Eyes:  ?   General:     ?   Right eye: No discharge.     ?   Left eye: No discharge.  ?   Conjunctiva/sclera: Conjunctivae normal.  ?Neck:  ?   Thyroid: No thyromegaly.  ?Cardiovascular:  ?   Rate and Rhythm: Normal rate and regular rhythm.  ?   Pulses: Normal pulses.  ?   Heart sounds: Normal heart sounds. No murmur heard. ?Pulmonary:  ?   Effort: Pulmonary effort is normal. No respiratory distress.  ?   Breath sounds: Normal breath sounds. No wheezing or rales.  ?Musculoskeletal:     ?   General: Normal range of motion.  ?   Cervical back: Normal range of motion and neck supple.  ?Lymphadenopathy:  ?   Cervical: No cervical adenopathy.  ?Skin: ?   General: Skin is warm and dry.  ?   Findings: No erythema or rash.  ?Neurological:  ?   Mental Status: He is alert and oriented to person, place, and time. Mental status is at baseline.  ?Psychiatric:     ?   Mood and Affect: Mood normal.     ?  Behavior: Behavior normal.     ?   Thought Content: Thought content normal.  ?   Comments: Well groomed, good eye contact, normal speech and thoughts  ? ?Results for orders placed or performed in visit on 06/21/19  ?[place] - A1c LAB Hemoglobin A1C physical  ?Result Value Ref Range  ? Hgb A1c MFr Bld 5.8 (H) <5.7 % of total Hgb  ? Mean Plasma Glucose 120 (calc)  ? eAG (mmol/L) 6.6 (calc)  ?[place] - CBC with Differential/Platelet physical  ?Result Value Ref Range  ? WBC 4.2 3.8 - 10.8 Thousand/uL  ? RBC 4.93 4.20 - 5.80 Million/uL  ? Hemoglobin 16.1 13.2 - 17.1 g/dL  ? HCT 46.2 38.5 - 50.0 %  ? MCV 93.7 80.0 - 100.0 fL  ? MCH 32.7 27.0 - 33.0 pg  ? MCHC 34.8 32.0 - 36.0 g/dL  ? RDW 12.4 11.0 - 15.0 %  ? Platelets 232 140 - 400 Thousand/uL  ? MPV 11.0 7.5 - 12.5 fL  ? Neutro Abs 2,197 1,500 - 7,800 cells/uL  ? Lymphs Abs 1,411 850 - 3,900 cells/uL  ? Absolute Monocytes 281 200 - 950 cells/uL  ? Eosinophils Absolute 252 15 - 500 cells/uL  ? Basophils Absolute 59 0 - 200 cells/uL  ? Neutrophils  Relative % 52.3 %  ? Total Lymphocyte 33.6 %  ? Monocytes Relative 6.7 %  ? Eosinophils Relative 6.0 %  ? Basophils Relative 1.4 %  ?[place] - CMET w/ GFR CMP Complete Metabolic Panel physical  ?Result Value Ref Range  ? Glucose, Bld 110 (H) 65 - 99 mg/dL  ? BUN 19 7 - 25 mg/dL  ? Creat 0.96 0.60 - 1.35 mg/dL  ? GFR, Est Non African American 94 > OR = 60 mL/min/1.2m2  ? GFR, Est African American 109 > OR = 60 mL/min/1.31m2  ? BUN/Creatinine Ratio NOT APPLICABLE 6 - 22 (calc)  ? Sodium 139 135 - 146 mmol/L  ? Potassium 4.4 3.5 - 5.3 mmol/L  ? Chloride 102 98 - 110 mmol/L  ? CO2 26 20 - 32 mmol/L  ? Calcium 10.0 8.6 - 10.3 mg/dL  ? Total Protein 7.4 6.1 - 8.1 g/dL  ? Albumin 5.0 3.6 - 5.1 g/dL  ? Globulin 2.4 1.9 - 3.7 g/dL (calc)  ? AG Ratio 2.1 1.0 - 2.5 (calc)  ? Total Bilirubin 0.7 0.2 - 1.2 mg/dL  ? Alkaline phosphatase (APISO) 51 36 - 130 U/L  ? AST 28 10 - 40 U/L  ? ALT 32 9 - 46 U/L  ?[facility] - Lipid panel physical  ?Result Value Ref Range  ? Cholesterol 229 (H) <200 mg/dL  ? HDL 62 > OR = 40 mg/dL  ? Triglycerides 100 <150 mg/dL  ? LDL Cholesterol (Calc) 146 (H) mg/dL (calc)  ? Total CHOL/HDL Ratio 3.7 <5.0 (calc)  ? Non-HDL Cholesterol (Calc) 167 (H) <130 mg/dL (calc)  ?[facility] - PSA physical  ?Result Value Ref Range  ? PSA 0.3 < OR = 4.0 ng/mL  ? ?   ?Assessment & Plan:  ? ?Problem List Items Addressed This Visit   ?None ?Visit Diagnoses   ? ? Allergic asthma, mild persistent, uncomplicated    -  Primary  ? Relevant Medications  ? fluticasone-salmeterol (ADVAIR DISKUS) 250-50 MCG/ACT AEPB  ? Dysphagia, unspecified type      ? Seasonal allergic rhinitis due to other allergic trigger      ? ?  ? ?Seems multifactorial problem at this time. Difficult to discern  if this is post covid asthma /related breathing concern or Silent GERD LPR or actual dysphagia related to swallowing/esophagus.  ? ?Likely asthma component with bronchospasm ?Start Advair inhaler 1 puff twice a day for now, continue until our next visit. ?Use  the albuterol rescue as needed. ? ?Discussed other etiology such as allergy sinus related. ? ?He has not endorsed sinus drainage congestion, therefore he prefers to defer using Flonase for now. We discussed it could be an option to reduce some of these symptoms in future. ? ?Next discussed GERD therapy, trial on PPI therapy Omeprazole 20mg  OTC daily for 2-4 weeks as next option if inhaler not successful. ? ?Elevated head of bed, advice for PVS ? ?If still having problem with this, then we can follow-up 4-6 weeks and determine if need ENT vs GI as next referral. If more dysphagia or if more breathing/throat related. ? ? ?Meds ordered this encounter  ?Medications  ? fluticasone-salmeterol (ADVAIR DISKUS) 250-50 MCG/ACT AEPB  ?  Sig: Inhale 1 puff into the lungs in the morning and at bedtime.  ?  Dispense:  60 each  ?  Refill:  2  ? ? ?Follow up plan: ?Return in about 4 weeks (around 12/31/2021), or if symptoms worsen or fail to improve. ? ? ?Nobie Putnam, DO ?[facility] ?[facility] ?12/03/2021, 11:20 AM ?

## 2021-12-03 NOTE — Patient Instructions (Addendum)
Thank you for coming to the office today. ? ?Likely asthma component ? ?Start Advair inhaler 1 puff twice a day for now, continue until our next visit. ? ?Use the albuterol rescue as needed. ? ?If this doesn't help, try the stomach acid pill ? ?Your symptoms sound most consistent with Silent Reflux or (Laryngopharyngeal Reflux), this is similar to Acid Reflux (or GERD) but usually involves the Throat and has a variety of symptoms including a "globus sensation", or air bubble or pressure in throat.  ?Commonly occurs in patients who have had some symptoms of traditional heartburn before, but this can occur even when not eating spicy foods. ?- If you want to try the stomach acid pill if the inhaler is not working ?- you may Start Omeprazole 20mg  - Take one capsule 30 min before first meal of day, same time every day for at least 2 weeks, max treatment up to 4 weeks then stop. If it resolves but then comes back again, you can repeat the 2-4 week course again as needed. ?- Avoid spicy, greasy, fried foods, also things like caffeine, dark chocolate, peppermint can worsen ?- Avoid large meals and late night snacks, also do not go more than 4-5 hours without a snack or meal (not eating will worsen reflux symptoms due to stomach acid) ? ?If the problem improves but keeps coming back, we can discuss higher dose or longer course at next visit. ? ?If symptoms are worsening, persistent symptoms despite treatment or develop esophageal or abdominal pain, unable to swallow solids or liquids, nausea, vomiting, fever/chills, or unintentional weight loss / no appetite, please follow-up sooner or seek more immediate medical attention. ? ? ?If nothing is helping in about 4-6 weeks let me know on message or call, or come back and we can refer you to a specialist, either ENT Throat specialty or GI gastro specialist for swallowing. ? ? ?Please schedule a Follow-up Appointment to: Return in about 4 weeks (around 12/31/2021), or if symptoms  worsen or fail to improve. ? ?If you have any other questions or concerns, please feel free to call the office or send a message through MyChart. You may also schedule an earlier appointment if necessary. ? ?Additionally, you may be receiving a survey about your experience at our office within a few days to 1 week by e-mail or mail. We value your feedback. ? ?03/02/2022, DO ?[facility], [facility] ?

## 2022-06-03 ENCOUNTER — Telehealth: Payer: 59 | Admitting: Family Medicine

## 2023-07-01 ENCOUNTER — Ambulatory Visit: Payer: Self-pay | Admitting: Family Medicine

## 2023-07-08 ENCOUNTER — Ambulatory Visit: Payer: Self-pay

## 2023-07-08 NOTE — Telephone Encounter (Signed)
  Chief Complaint: mild HA, BP of 135/87, and sometimes leg swelling Symptoms: above Frequency: ongoing Pertinent Negatives: Patient denies redness, warmth in legs. Legs not swelling today. HA is mild/moderate. Pt reports elevated BP, but BP is 135/87 Disposition: [] ED /[] Urgent Care (no appt availability in office) / [x] Appointment(In office/virtual)/ []  [facility] [facility]/ [] Home Care/ [] Refused Recommended Disposition /[] [place] [facility]/ []  Follow-up with PCP Additional Notes: Call transferred to NP for leg swelling redness, and pain. Pt denies these s/s. He states that swelling is sometimes, no redness or warmth, Mild moderate HA, and Elevated BP yesterday. Today BP is 135/87. Pt wanted to be checked over. Appt made for Thursday.    Reason for Disposition  Nursing judgment  Protocols used: No Guideline or Reference Available-[facility]

## 2023-07-10 ENCOUNTER — Encounter: Payer: Self-pay | Admitting: Family Medicine

## 2023-07-10 ENCOUNTER — Ambulatory Visit (INDEPENDENT_AMBULATORY_CARE_PROVIDER_SITE_OTHER): Payer: Self-pay | Admitting: Family Medicine

## 2023-07-10 VITALS — BP 114/72 | HR 78 | Ht 67.0 in | Wt 191.0 lb

## 2023-07-10 DIAGNOSIS — R7309 Other abnormal glucose: Secondary | ICD-10-CM

## 2023-07-10 DIAGNOSIS — R030 Elevated blood-pressure reading, without diagnosis of hypertension: Secondary | ICD-10-CM

## 2023-07-10 DIAGNOSIS — Z125 Encounter for screening for malignant neoplasm of prostate: Secondary | ICD-10-CM

## 2023-07-10 DIAGNOSIS — E7800 Pure hypercholesterolemia, unspecified: Secondary | ICD-10-CM

## 2023-07-10 NOTE — Progress Notes (Signed)
Subjective:    Patient ID: Maxwell Cooper, male    DOB: [birth year], 52 y.o.   MRN: [id number]  Maxwell Cooper is a 52 y.o. male presenting on 07/10/2023 for Acute Visit (Last week headache afterwards tired, checked blood pressure high. )   HPI  Discussed the use of AI scribe software for clinical note transcription with the patient, who gave verbal consent to proceed.  Elevated BP without Hypertension Headache Tinnitus  He presents with a chief complaint of persistent R sided tinnitus and associated headaches. The tinnitus, described as a constant hum or buzz, began a few weeks ago and has been unrelenting since. The patient noticed the onset of symptoms after returning from a walk. Accompanying the tinnitus, the patient has been experiencing headaches, initially localized to the right side of the head, but later felt a heaviness in L ear  The patient also reported a recent episode of elevated blood pressure, measuring 160/96, which prompted lifestyle modifications including dietary changes and cessation of alcohol consumption. The patient's blood pressure has since returned to normal ranges.  The patient also reported a recent stressful event involving his son's injury and subsequent medical care, which may have contributed to the elevated blood pressure and onset of symptoms.  The patient has been managing the headaches with Tylenol, and has not started any new medications. The patient has not reported any changes in vision, hearing loss, or vertigo associated with the tinnitus and headaches. Denies chest pain or dyspnea  Family history of hypertension      07/10/2023    9:52 AM 12/03/2021   11:24 AM 06/21/2019   10:01 AM  Depression screen PHQ 2/9  Decreased Interest 0 0 0  Down, Depressed, Hopeless 1 0 0  PHQ - 2 Score 1 0 0  Altered sleeping 0    Tired, decreased energy 1    Change in appetite 1    Feeling bad or failure about yourself  0    Trouble concentrating 0    Moving  slowly or fidgety/restless 0    Suicidal thoughts 0    PHQ-9 Score 3         07/10/2023    9:53 AM  GAD 7 : Generalized Anxiety Score  Nervous, Anxious, on Edge 0  Control/stop worrying 0  Worry too much - different things 0  Trouble relaxing 0  Restless 0  Easily annoyed or irritable 0  Afraid - awful might happen 0  Total GAD 7 Score 0    Social History   Tobacco Use   Smoking status: Never   Smokeless tobacco: Never  Substance Use Topics   Alcohol use: Yes   Drug use: Never    Review of Systems  Constitutional:  Negative for activity change, appetite change, chills, diaphoresis, fatigue and fever.  HENT:  Positive for tinnitus. Negative for congestion and hearing loss.   Eyes:  Negative for visual disturbance.  Respiratory:  Negative for cough, chest tightness, shortness of breath and wheezing.   Cardiovascular:  Negative for chest pain, palpitations and leg swelling.  Gastrointestinal:  Negative for abdominal pain, constipation, diarrhea, nausea and vomiting.  Genitourinary:  Negative for dysuria, frequency and hematuria.  Musculoskeletal:  Negative for arthralgias and neck pain.  Skin:  Negative for rash.  Neurological:  Positive for headaches. Negative for dizziness, weakness, light-headedness and numbness.  Hematological:  Negative for adenopathy.  Psychiatric/Behavioral:  Negative for behavioral problems, dysphoric mood and sleep disturbance.    Per HPI unless specifically  indicated above     Objective:    BP 114/72 (BP Location: Left Arm, Cuff Size: Normal)   Pulse 78   Ht 5\' 7"  (1.702 m)   Wt 191 lb (86.6 kg)   BMI 29.91 kg/m   Wt Readings from Last 3 Encounters:  07/10/23 191 lb (86.6 kg)  12/03/21 186 lb 3.2 oz (84.5 kg)  10/04/19 187 lb (84.8 kg)    Physical Exam Vitals and nursing note reviewed.  Constitutional:      General: He is not in acute distress.    Appearance: He is well-developed. He is not diaphoretic.     Comments:  Well-appearing, comfortable, cooperative  HENT:     Head: Normocephalic and atraumatic.     Right Ear: Tympanic membrane, ear canal and external ear normal. There is no impacted cerumen.     Left Ear: Tympanic membrane, ear canal and external ear normal. There is no impacted cerumen.  Eyes:     General:        Right eye: No discharge.        Left eye: No discharge.     Conjunctiva/sclera: Conjunctivae normal.     Pupils: Pupils are equal, round, and reactive to light.  Neck:     Thyroid: No thyromegaly.  Cardiovascular:     Rate and Rhythm: Normal rate and regular rhythm.     Pulses: Normal pulses.     Heart sounds: Normal heart sounds. No murmur heard. Pulmonary:     Effort: Pulmonary effort is normal. No respiratory distress.     Breath sounds: Normal breath sounds. No wheezing or rales.  Abdominal:     General: Bowel sounds are normal. There is no distension.     Palpations: Abdomen is soft. There is no mass.     Tenderness: There is no abdominal tenderness.  Musculoskeletal:        General: No tenderness. Normal range of motion.     Cervical back: Normal range of motion and neck supple.     Comments: Upper / Lower Extremities: - Normal muscle tone, strength bilateral upper extremities 5/5, lower extremities 5/5  Lymphadenopathy:     Cervical: No cervical adenopathy.  Skin:    General: Skin is warm and dry.     Findings: No erythema or rash.  Neurological:     Mental Status: He is alert and oriented to person, place, and time.     Comments: Distal sensation intact to light touch all extremities  Psychiatric:        Mood and Affect: Mood normal.        Behavior: Behavior normal.        Thought Content: Thought content normal.     Comments: Well groomed, good eye contact, normal speech and thoughts     Results for orders placed or performed in visit on 06/21/19  [facility] - A1c LAB Hemoglobin A1C physical   Collection Time: 06/21/19 10:53 AM  Result Value Ref Range   Hgb  A1c MFr Bld 5.8 (H) <5.7 % of total Hgb   Mean Plasma Glucose 120 (calc)   eAG (mmol/L) 6.6 (calc)  [facility] - CBC with Differential/Platelet physical   Collection Time: 06/21/19 10:53 AM  Result Value Ref Range   WBC 4.2 3.8 - 10.8 Thousand/uL   RBC 4.93 4.20 - 5.80 Million/uL   Hemoglobin 16.1 13.2 - 17.1 g/dL   HCT 82.9 56.2 - 13.0 %   MCV 93.7 80.0 - 100.0 fL   MCH  32.7 27.0 - 33.0 pg   MCHC 34.8 32.0 - 36.0 g/dL   RDW 85.6 31.4 - 97.0 %   Platelets 232 140 - 400 Thousand/uL   MPV 11.0 7.5 - 12.5 fL   Neutro Abs 2,197 1,500 - 7,800 cells/uL   Lymphs Abs 1,411 850 - 3,900 cells/uL   Absolute Monocytes 281 200 - 950 cells/uL   Eosinophils Absolute 252 15 - 500 cells/uL   Basophils Absolute 59 0 - 200 cells/uL   Neutrophils Relative % 52.3 %   Total Lymphocyte 33.6 %   Monocytes Relative 6.7 %   Eosinophils Relative 6.0 %   Basophils Relative 1.4 %  [facility] - CMET w/ GFR CMP Complete Metabolic Panel physical   Collection Time: 06/21/19 10:53 AM  Result Value Ref Range   Glucose, Bld 110 (H) 65 - 99 mg/dL   BUN 19 7 - 25 mg/dL   Creat 2.63 7.85 - 8.85 mg/dL   GFR, Est Non African American 94 > OR = 60 mL/min/1.36m2   GFR, Est African American 109 > OR = 60 mL/min/1.55m2   BUN/Creatinine Ratio NOT APPLICABLE 6 - 22 (calc)   Sodium 139 135 - 146 mmol/L   Potassium 4.4 3.5 - 5.3 mmol/L   Chloride 102 98 - 110 mmol/L   CO2 26 20 - 32 mmol/L   Calcium 10.0 8.6 - 10.3 mg/dL   Total Protein 7.4 6.1 - 8.1 g/dL   Albumin 5.0 3.6 - 5.1 g/dL   Globulin 2.4 1.9 - 3.7 g/dL (calc)   AG Ratio 2.1 1.0 - 2.5 (calc)   Total Bilirubin 0.7 0.2 - 1.2 mg/dL   Alkaline phosphatase (APISO) 51 36 - 130 U/L   AST 28 10 - 40 U/L   ALT 32 9 - 46 U/L  [facility] - Lipid panel physical   Collection Time: 06/21/19 10:53 AM  Result Value Ref Range   Cholesterol 229 (H) <200 mg/dL   HDL 62 > OR = 40 mg/dL   Triglycerides 027 <741 mg/dL   LDL Cholesterol (Calc) 146 (H) mg/dL (calc)   Total CHOL/HDL Ratio 3.7  <5.0 (calc)   Non-HDL Cholesterol (Calc) 167 (H) <130 mg/dL (calc)  [facility] - PSA physical   Collection Time: 06/21/19 10:53 AM  Result Value Ref Range   PSA 0.3 < OR = 4.0 ng/mL      Assessment & Plan:   Problem List Items Addressed This Visit     Elevated BP without diagnosis of hypertension - Primary   Relevant Orders   COMPLETE METABOLIC PANEL WITH GFR   CBC with Differential/Platelet   Elevated hemoglobin A1c   Relevant Orders   Hemoglobin A1c   Other Visit Diagnoses       Pure hypercholesterolemia       Relevant Orders   Lipid panel   COMPLETE METABOLIC PANEL WITH GFR   TSH     Screening for prostate cancer       Relevant Orders   PSA         Tinnitus and Headache New onset of R tinnitus and bilateral headache, with tinnitus described as a constant hum or buzz. No abnormalities noted on ear examination. Possible association with recent stress and transient hypertension. -Order comprehensive blood panel to rule out systemic causes. -Advise patient to continue managing headaches with Tylenol or ibuprofen as needed. -Consider ENT consultation if symptoms persist.  Elevated BP without prior dx HTN Recent self-reported high blood pressure readings, but normal reading in office today.  Possible association with recent stress. Improved after cessation of alcohol and possible dietary triggers Defer BP medication at this time -Monitor blood pressure at home and return for follow-up if readings consistently high.  General Health Maintenance -Order Cologuard test for colon cancer screening. -Follow-up as needed or in six months for routine check.        Orders Placed This Encounter  Procedures   Lipid panel    Has the patient fasted?:   Yes   Hemoglobin A1c   COMPLETE METABOLIC PANEL WITH GFR   CBC with Differential/Platelet   PSA   TSH    No orders of the defined types were placed in this encounter.   Follow up plan: Return if symptoms worsen or fail to  improve.  Saralyn Pilar, DO [facility] 07/10/2023, 9:29 AM

## 2023-07-10 NOTE — Patient Instructions (Addendum)
Thank you for coming to the office today.  BP improved on repeat check. I think stress related and anxiety  Ears look fine. I don't see a problem in the ear canals or ear drums.  The buzzing and ringing is likely with the high blood pressure and headache.  For Tylenol Recommend Extra Strength 500mg  x 1 or 2 pills = 500 to 1000mg  per dose up to 3 times per day if need  For Anti inflammatory Recommend Ibuprofen Advil 200mg  x 3 = 600mg  per dose up to 3 times a day with meal if need  Can also try Excedrin Migraine OTC if need to try.  Mix and match  If headache does not go away, contact us back  Colon Cancer Screening: - For all adults age 65+ routine colon cancer screening is highly recommended.     - Recent guidelines from American [employer] Society recommend starting age of 63 - Early detection of colon cancer is important, because often there are no warning signs or symptoms, also if found early usually it can be cured. Late stage is hard to treat.  - If you are not interested in Colonoscopy screening (if done and normal you could be cleared for 5 to 10 years until next due), then Cologuard is an excellent alternative for screening test for Colon Cancer. It is highly sensitive for detecting DNA of colon cancer from even the earliest stages. Also, there is NO bowel prep required. - If Cologuard is NEGATIVE, then it is good for 3 years before next due - If Cologuard is POSITIVE, then it is strongly advised to get a Colonoscopy, which allows the GI doctor to locate the source of the cancer or polyp (even very early stage) and treat it by removing it. ------------------------- If you would like to proceed with Cologuard (stool DNA test) - FIRST, call your insurance company and tell them you want to check cost of Cologuard tell them CPT Code 40981 (it may be completely covered and you could get for no cost, OR max cost without any coverage is about $600). Also, keep in mind if you do NOT open  the kit, and decide not to do the test, you will NOT be charged, you should contact the company if you decide not to do the test. - If you want to proceed, you can notify us (phone message, MyChart Message, or at next visit) and we will order it for you. The test kit will be delivered to you house within about 1 week. Follow instructions to collect sample, you may call the company for any help or questions, 24/7 telephone support at [phone].  Labs today    Please schedule a Follow-up Appointment to: Return if symptoms worsen or fail to improve.  If you have any other questions or concerns, please feel free to call the office or send a message through MyChart. You may also schedule an earlier appointment if necessary.  Additionally, you may be receiving a survey about your experience at our office within a few days to 1 week by e-mail or mail. We value your feedback.  Saralyn Pilar, DO [facility], [place]

## 2023-07-11 LAB — COMPLETE METABOLIC PANEL WITH GFR
AG Ratio: 1.9 (calc) (ref 1.0–2.5)
ALT: 48 U/L — ABNORMAL HIGH (ref 9–46)
AST: 29 U/L (ref 10–35)
Albumin: 4.9 g/dL (ref 3.6–5.1)
Alkaline phosphatase (APISO): 48 U/L (ref 35–144)
BUN: 22 mg/dL (ref 7–25)
CO2: 31 mmol/L (ref 20–32)
Calcium: 10.4 mg/dL — ABNORMAL HIGH (ref 8.6–10.3)
Chloride: 101 mmol/L (ref 98–110)
Creat: 0.86 mg/dL (ref 0.70–1.30)
Globulin: 2.6 g/dL (ref 1.9–3.7)
Glucose, Bld: 95 mg/dL (ref 65–139)
Potassium: 4.5 mmol/L (ref 3.5–5.3)
Sodium: 139 mmol/L (ref 135–146)
Total Bilirubin: 0.7 mg/dL (ref 0.2–1.2)
Total Protein: 7.5 g/dL (ref 6.1–8.1)
eGFR: 105 mL/min/{1.73_m2} (ref >=60)

## 2023-07-11 LAB — CBC WITH DIFFERENTIAL/PLATELET
Absolute Lymphocytes: 1441 {cells}/uL (ref 850–3900)
Absolute Monocytes: 387 {cells}/uL (ref 200–950)
Basophils Absolute: 52 {cells}/uL (ref 0–200)
Basophils Relative: 1.2 %
Eosinophils Absolute: 108 {cells}/uL (ref 15–500)
Eosinophils Relative: 2.5 %
HCT: 47.3 % (ref 38.5–50.0)
Hemoglobin: 16.2 g/dL (ref 13.2–17.1)
MCH: 32.2 pg (ref 27.0–33.0)
MCHC: 34.2 g/dL (ref 32.0–36.0)
MCV: 94 fL (ref 80.0–100.0)
MPV: 11.4 fL (ref 7.5–12.5)
Monocytes Relative: 9 %
Neutro Abs: 2313 {cells}/uL (ref 1500–7800)
Neutrophils Relative %: 53.8 %
Platelets: 238 10*3/uL (ref 140–400)
RBC: 5.03 10*6/uL (ref 4.20–5.80)
RDW: 12.2 % (ref 11.0–15.0)
Total Lymphocyte: 33.5 %
WBC: 4.3 10*3/uL (ref 3.8–10.8)

## 2023-07-11 LAB — TSH: TSH: 1.61 m[IU]/L (ref 0.40–4.50)

## 2023-07-11 LAB — HEMOGLOBIN A1C
Hgb A1c MFr Bld: 6 %{Hb} — ABNORMAL HIGH (ref <5.7)
Mean Plasma Glucose: 126 mg/dL
eAG (mmol/L): 7 mmol/L

## 2023-07-11 LAB — LIPID PANEL
Cholesterol: 209 mg/dL — ABNORMAL HIGH (ref <200)
HDL: 50 mg/dL (ref >=40)
LDL Cholesterol (Calc): 139 mg/dL — ABNORMAL HIGH
Non-HDL Cholesterol (Calc): 159 mg/dL — ABNORMAL HIGH (ref <130)
Total CHOL/HDL Ratio: 4.2 (calc) (ref <5.0)
Triglycerides: 97 mg/dL (ref <150)

## 2023-07-11 LAB — PSA: PSA: 0.35 ng/mL (ref <=4.00)

## 2023-07-21 ENCOUNTER — Ambulatory Visit: Payer: Self-pay | Admitting: Family Medicine

## 2024-07-26 ENCOUNTER — Other Ambulatory Visit: Payer: Self-pay | Admitting: Family Medicine

## 2024-07-26 ENCOUNTER — Ambulatory Visit (INDEPENDENT_AMBULATORY_CARE_PROVIDER_SITE_OTHER): Payer: Self-pay | Admitting: Family Medicine

## 2024-07-26 VITALS — BP 120/80 | HR 77 | Ht 67.0 in | Wt 192.0 lb

## 2024-07-26 DIAGNOSIS — E7800 Pure hypercholesterolemia, unspecified: Secondary | ICD-10-CM

## 2024-07-26 DIAGNOSIS — Z0000 Encounter for general adult medical examination without abnormal findings: Secondary | ICD-10-CM | POA: Diagnosis not present

## 2024-07-26 DIAGNOSIS — R7309 Other abnormal glucose: Secondary | ICD-10-CM

## 2024-07-26 DIAGNOSIS — E66811 Obesity, class 1: Secondary | ICD-10-CM | POA: Diagnosis not present

## 2024-07-26 DIAGNOSIS — H9313 Tinnitus, bilateral: Secondary | ICD-10-CM | POA: Diagnosis not present

## 2024-07-26 DIAGNOSIS — Z1211 Encounter for screening for malignant neoplasm of colon: Secondary | ICD-10-CM

## 2024-07-26 DIAGNOSIS — Z125 Encounter for screening for malignant neoplasm of prostate: Secondary | ICD-10-CM

## 2024-07-26 NOTE — Progress Notes (Signed)
 "  Subjective:    Patient ID: Maxwell Cooper, male    DOB: Nov 28, 1970, 53 y.o.   MRN: [id number]  Maxwell Cooper is a 53 y.o. male presenting on 07/26/2024 for Annual Exam   HPI  Discussed the use of AI scribe software for clinical note transcription with the patient, who gave verbal consent to proceed.  History of Present Illness   Maxwell Cooper is a 53 year old male who presents for an annual physical exam.  HYPERLIPIDEMIA: - Reports no concerns. Last lipid panel elevated LDL 130-140 range, not on statin. Due for labs today  Pre-Diabetes A1c 6.0 previously Due for lab  Muscle Spasm / Trapezius / Levator Scapulae - Bilateral neck vs shoulder pain present for approximately two weeks - Pain is strong in intensity if flared - Exacerbated by pressing or lifting shoulders - Pain is noticeable during sleep and with prolonged standing - Associated with a sensation of tightness - Denies numbness or other radiating symptoms  Tinnitus - Bilateral ear ringing - Family history of similar symptoms in his father  Bronchospasm / Intermittent wheezing - Wheezing occurs occasionally, particularly in winter with exposure to cold air - Uses an Albuterol inhaler as needed for symptoms - Does not use the stronger disc inhaler daily. OFF Advair - No other respiratory symptoms reported  Supplement use - Not currently on prescribed medications - Occasionally takes magnesium, fish oil, and biotin     Family history of hypertension  Health Maintenance: Ordered Cologuard screening Offered Prevnar, Shingles vaccines he defers for now     07/26/2024    9:11 AM 07/10/2023    9:52 AM 12/03/2021   11:24 AM  Depression screen PHQ 2/9  Decreased Interest 0 0 0  Down, Depressed, Hopeless 0 1 0  PHQ - 2 Score 0 1 0  Altered sleeping  0   Tired, decreased energy  1   Change in appetite  1   Feeling bad or failure about yourself   0   Trouble concentrating  0   Moving slowly or fidgety/restless  0    Suicidal thoughts  0   PHQ-9 Score  3       Data saved with a previous flowsheet row definition       07/26/2024    9:11 AM 07/10/2023    9:53 AM  GAD 7 : Generalized Anxiety Score  Nervous, Anxious, on Edge 0 0  Control/stop worrying 0 0  Worry too much - different things 0 0  Trouble relaxing 0 0  Restless 0 0  Easily annoyed or irritable 0 0  Afraid - awful might happen 0 0  Total GAD 7 Score 0 0     History reviewed. No pertinent past medical history. Past Surgical History:  Procedure Laterality Date   CATARACT EXTRACTION  2018   RETINAL LASER PROCEDURE  2000   Social History   Socioeconomic History   Marital status: Married    Spouse name: Not on file   Number of children: Not on file   Years of education: college   Highest education level: Associate degree: academic program  Occupational History   Occupation: Sushi Chef  Tobacco Use   Smoking status: Never   Smokeless tobacco: Never  Substance and Sexual Activity   Alcohol use: Yes   Drug use: Never   Sexual activity: Not on file  Other Topics Concern   Not on file  Social History Narrative   Not on file   Social Drivers  of Health   Tobacco Use: Low Risk (07/26/2024)   Patient History    Smoking Tobacco Use: Never    Smokeless Tobacco Use: Never    Passive Exposure: Not on file  Financial Resource Strain: Low Risk (07/26/2024)   Overall Financial Resource Strain (CARDIA)    Difficulty of Paying Living Expenses: Not very hard  Food Insecurity: No Food Insecurity (07/26/2024)   Epic    Worried About Radiation Protection Practitioner of Food in the Last Year: Never true    Ran Out of Food in the Last Year: Never true  Transportation Needs: No Transportation Needs (07/26/2024)   Epic    Lack of Transportation (Medical): No    Lack of Transportation (Non-Medical): No  Physical Activity: Inactive (07/26/2024)   Exercise Vital Sign    Days of Exercise per Week: 0 days    Minutes of Exercise per Session: Not on file   Stress: No Stress Concern Present (07/26/2024)   [employer] of Occupational Health - Occupational Stress Questionnaire    Feeling of Stress: Only a little  Social Connections: Socially Isolated (07/26/2024)   Social Connection and Isolation Panel    Frequency of Communication with Friends and Family: Once a week    Frequency of Social Gatherings with Friends and Family: Once a week    Attends Religious Services: Never    Database Administrator or Organizations: No    Attends Engineer, Structural: Not on file    Marital Status: Married  Catering Manager Violence: Not At Risk (07/10/2023)   Humiliation, Afraid, Rape, and Kick questionnaire    Fear of Current or Ex-Partner: No    Emotionally Abused: No    Physically Abused: No    Sexually Abused: No  Depression (PHQ2-9): Low Risk (07/26/2024)   Depression (PHQ2-9)    PHQ-2 Score: 0  Alcohol Screen: Low Risk (07/26/2024)   Alcohol Screen    Last Alcohol Screening Score (AUDIT): 3  Housing: Unknown (07/26/2024)   Epic    Unable to Pay for Housing in the Last Year: No    Number of Times Moved in the Last Year: Not on file    Homeless in the Last Year: No  Utilities: Not At Risk (07/10/2023)   [facility] Utilities    Threatened with loss of utilities: No  Health Literacy: Adequate Health Literacy (07/10/2023)   B1300 Health Literacy    Frequency of need for help with medical instructions: Never   Family History  Problem Relation Age of Onset   Hypertension Mother    Dementia Mother    Lung cancer Paternal Grandfather        smoking   Colon cancer Maternal Grandmother    Prostate cancer Neg Hx    Current Outpatient Medications on File Prior to Visit  Medication Sig   albuterol (VENTOLIN HFA) 108 (90 Base) MCG/ACT inhaler SMARTSIG:1-2 Puff(s) By Mouth Every 4-6 Hours (Patient not taking: Reported on 07/26/2024)   No current facility-administered medications on file prior to visit.    Review of Systems   Constitutional:  Negative for activity change, appetite change, chills, diaphoresis, fatigue and fever.  HENT:  Positive for tinnitus. Negative for congestion and hearing loss.   Eyes:  Negative for visual disturbance.  Respiratory:  Negative for cough, chest tightness, shortness of breath and wheezing.   Cardiovascular:  Negative for chest pain, palpitations and leg swelling.  Gastrointestinal:  Negative for abdominal pain, constipation, diarrhea, nausea and vomiting.  Genitourinary:  Negative for dysuria,  frequency and hematuria.  Musculoskeletal:  Positive for neck pain. Negative for arthralgias.  Skin:  Negative for rash.  Neurological:  Negative for dizziness, weakness, light-headedness, numbness and headaches.  Hematological:  Negative for adenopathy.  Psychiatric/Behavioral:  Negative for behavioral problems, dysphoric mood and sleep disturbance.    Per HPI unless specifically indicated above     Objective:    BP 120/80 (BP Location: Right Arm, Patient Position: Sitting, Cuff Size: Normal)   Pulse 77   Ht 5' 7 (1.702 m)   Wt 192 lb (87.1 kg)   SpO2 94%   BMI 30.07 kg/m   Wt Readings from Last 3 Encounters:  07/26/24 192 lb (87.1 kg)  07/10/23 191 lb (86.6 kg)  12/03/21 186 lb 3.2 oz (84.5 kg)    Physical Exam Vitals and nursing note reviewed.  Constitutional:      General: He is not in acute distress.    Appearance: He is well-developed. He is not diaphoretic.     Comments: Well-appearing, comfortable, cooperative  HENT:     Head: Normocephalic and atraumatic.     Right Ear: Tympanic membrane, ear canal and external ear normal. There is no impacted cerumen.     Left Ear: Tympanic membrane, ear canal and external ear normal. There is no impacted cerumen.     Ears:     Comments: Some Cerumen L ear not impacted  Eyes:     General:        Right eye: No discharge.        Left eye: No discharge.     Conjunctiva/sclera: Conjunctivae normal.     Pupils: Pupils are  equal, round, and reactive to light.  Neck:     Thyroid: No thyromegaly.     Vascular: No carotid bruit.  Cardiovascular:     Rate and Rhythm: Normal rate and regular rhythm.     Pulses: Normal pulses.     Heart sounds: Normal heart sounds. No murmur heard. Pulmonary:     Effort: Pulmonary effort is normal. No respiratory distress.     Breath sounds: Normal breath sounds. No wheezing or rales.  Abdominal:     General: Bowel sounds are normal. There is no distension.     Palpations: Abdomen is soft. There is no mass.     Tenderness: There is no abdominal tenderness.  Musculoskeletal:        General: No tenderness. Normal range of motion.     Cervical back: Normal range of motion and neck supple.     Right lower leg: No edema.     Left lower leg: No edema.     Comments: Trapezius / Levator scap muscules bilateral hypertonicity.  Upper / Lower Extremities: - Normal muscle tone, strength bilateral upper extremities 5/5, lower extremities 5/5  Lymphadenopathy:     Cervical: No cervical adenopathy.  Skin:    General: Skin is warm and dry.     Findings: No erythema or rash.  Neurological:     Mental Status: He is alert and oriented to person, place, and time.     Comments: Distal sensation intact to light touch all extremities  Psychiatric:        Mood and Affect: Mood normal.        Behavior: Behavior normal.        Thought Content: Thought content normal.     Comments: Well groomed, good eye contact, normal speech and thoughts     Results for orders placed or performed in  visit on 07/10/23  Lipid panel   Collection Time: 07/10/23  9:52 AM  Result Value Ref Range   Cholesterol 209 (H) <200 mg/dL   HDL 50 > OR = 40 mg/dL   Triglycerides 97 <849 mg/dL   LDL Cholesterol (Calc) 139 (H) mg/dL (calc)   Total CHOL/HDL Ratio 4.2 <5.0 (calc)   Non-HDL Cholesterol (Calc) 159 (H) <130 mg/dL (calc)  Hemoglobin J8r   Collection Time: 07/10/23  9:52 AM  Result Value Ref Range   Hgb  A1c MFr Bld 6.0 (H) <5.7 % of total Hgb   Mean Plasma Glucose 126 mg/dL   eAG (mmol/L) 7.0 mmol/L  COMPLETE METABOLIC PANEL WITH GFR   Collection Time: 07/10/23  9:52 AM  Result Value Ref Range   Glucose, Bld 95 65 - 139 mg/dL   BUN 22 7 - 25 mg/dL   Creat 9.13 9.29 - 8.69 mg/dL   eGFR 894 > OR = 60 fO/fpw/8.26f7   BUN/Creatinine Ratio SEE NOTE: 6 - 22 (calc)   Sodium 139 135 - 146 mmol/L   Potassium 4.5 3.5 - 5.3 mmol/L   Chloride 101 98 - 110 mmol/L   CO2 31 20 - 32 mmol/L   Calcium 10.4 (H) 8.6 - 10.3 mg/dL   Total Protein 7.5 6.1 - 8.1 g/dL   Albumin 4.9 3.6 - 5.1 g/dL   Globulin 2.6 1.9 - 3.7 g/dL (calc)   AG Ratio 1.9 1.0 - 2.5 (calc)   Total Bilirubin 0.7 0.2 - 1.2 mg/dL   Alkaline phosphatase (APISO) 48 35 - 144 U/L   AST 29 10 - 35 U/L   ALT 48 (H) 9 - 46 U/L  CBC with Differential/Platelet   Collection Time: 07/10/23  9:52 AM  Result Value Ref Range   WBC 4.3 3.8 - 10.8 Thousand/uL   RBC 5.03 4.20 - 5.80 Million/uL   Hemoglobin 16.2 13.2 - 17.1 g/dL   HCT 52.6 61.4 - 49.9 %   MCV 94.0 80.0 - 100.0 fL   MCH 32.2 27.0 - 33.0 pg   MCHC 34.2 32.0 - 36.0 g/dL   RDW 87.7 88.9 - 84.9 %   Platelets 238 140 - 400 Thousand/uL   MPV 11.4 7.5 - 12.5 fL   Neutro Abs 2,313 1,500 - 7,800 cells/uL   Absolute Lymphocytes 1,441 850 - 3,900 cells/uL   Absolute Monocytes 387 200 - 950 cells/uL   Eosinophils Absolute 108 15 - 500 cells/uL   Basophils Absolute 52 0 - 200 cells/uL   Neutrophils Relative % 53.8 %   Total Lymphocyte 33.5 %   Monocytes Relative 9.0 %   Eosinophils Relative 2.5 %   Basophils Relative 1.2 %  PSA   Collection Time: 07/10/23  9:52 AM  Result Value Ref Range   PSA 0.35 < OR = 4.00 ng/mL  TSH   Collection Time: 07/10/23  9:52 AM  Result Value Ref Range   TSH 1.61 0.40 - 4.50 mIU/L      Assessment & Plan:   Problem List Items Addressed This Visit     Elevated hemoglobin A1c   Relevant Orders   Hemoglobin A1c   Obesity (BMI 30.0-34.9)    Relevant Orders   TSH   Pure hypercholesterolemia   Relevant Orders   Lipid panel   TSH   Comprehensive metabolic panel with GFR   Other Visit Diagnoses       Annual physical exam    -  Primary   Relevant Orders   Lipid panel  Hemoglobin A1c   CBC with Differential/Platelet   PSA   TSH   Comprehensive metabolic panel with GFR     Screening for colon cancer       Relevant Orders   Cologuard     Screening for prostate cancer       Relevant Orders   PSA     Tinnitus of both ears            Updated Health Maintenance information Fasting labs today pending Encouraged improvement to lifestyle with diet and exercise Goal of weight loss  Adult Wellness Visit Annual wellness visit conducted. Blood pressure well-controlled. Discussed colon cancer screening options, recommended Cologuard. Discussed heart scan, deferred. Blood work ordered for cholesterol and glucose monitoring. - Ordered Cologuard for colon cancer screening. - Ordered blood work to monitor cholesterol and glucose levels. - Scheduled follow-up appointment for next year.  Pure hypercholesterolemia Cholesterol levels mildly elevated last year LDL 139-146 range.  Monitoring with blood work. - Ordered blood work to monitor cholesterol levels. - Counseling on diet / lifestyle management - Offer CT Coronary Calcium Score CT, He declines at this time.  Prediabetes Previous blood work indicated borderline elevated glucose levels. A1c 6.0 in past Monitoring with blood work. - Ordered blood work to monitor glucose levels. Encourage lifestyle modification  Tinnitus Bilateral tinnitus likely age-related, exacerbated by quiet environments. No effective cure, management includes avoiding loud environments and using ear protection. - Advised use of ear protection in loud environments.  Trapezius and shoulder myalgia Bilateral shoulder pain likely due to muscle tension or spasms in trapezius and levator scapulae  muscles. Discussed non-pharmacological management options, prefers non-pharmacological methods. - Recommended soft tissue massage and physical therapy. - Advised use of heat application and muscle rubs. - Provided information on trapezius and levator scapulae muscles.        Orders Placed This Encounter  Procedures   Cologuard   Lipid panel    Has the patient fasted?:   Yes   Hemoglobin A1c   CBC with Differential/Platelet   PSA   TSH   Comprehensive metabolic panel with GFR    Has the patient fasted?:   Yes    No orders of the defined types were placed in this encounter.    Follow up plan: Return for 1 year fasting lab > 1 week later Annual Physical.  Future labs 07/11/25  Marsa Officer, DO [facility] 07/26/2024, 9:36 AM  "

## 2024-07-26 NOTE — Patient Instructions (Addendum)
 Thank you for coming to the office today.  Trapezius Levator Scapulae muscles Spasm and tightening of muscles Likely from daily activities and strain and stress. Recommend heating pad or warm compresses to help soothe Can use topical muscle rub as well.  If not improving let me know we can consider muscle relaxant or referral to therapy Therapist or Massage.  --------------------  Colon Cancer Screening: Ordered the Cologuard (home kit) test for colon cancer screening. Stay tuned for further updates.  It will be shipped to you directly. If not received in 2-4 weeks, call us  or the company.   If you send it back and no results are received in 2-4 weeks, call us  or the company as well!   Recommend Pneumonia Prevnar-20 vaccine here or at the pharmacy, one dose will last 5 years.  Recommend Shingles vaccine at the pharmacy 2 doses 2-6 months apart, it can make you feel sick like the flu temporarily.  DUE for FASTING BLOOD WORK (no food or drink after midnight before the lab appointment, only water or coffee without cream/sugar on the morning of)  SCHEDULE Lab Only visit in the morning at the clinic for lab draw in 1 YEAR  - Make sure Lab Only appointment is at about 1 week before your next appointment, so that results will be available  For Lab Results, once available within 2-3 days of blood draw, you can can log in to MyChart online to view your results and a brief explanation. Also, we can discuss results at next follow-up visit.   Please schedule a Follow-up Appointment to: Return for 1 year fasting lab > 1 week later Annual Physical.  If you have any other questions or concerns, please feel free to call the office or send a message through MyChart. You may also schedule an earlier appointment if necessary.  Additionally, you may be receiving a survey about your experience at our office within a few days to 1 week by e-mail or mail. We value your feedback.  Marsa Officer, DO [facility], [place]

## 2024-07-27 LAB — COMPREHENSIVE METABOLIC PANEL WITH GFR
AG Ratio: 1.9 (calc) (ref 1.0–2.5)
ALT: 39 U/L (ref 9–46)
AST: 28 U/L (ref 10–35)
Albumin: 4.8 g/dL (ref 3.6–5.1)
Alkaline phosphatase (APISO): 57 U/L (ref 35–144)
BUN: 21 mg/dL (ref 7–25)
CO2: 27 mmol/L (ref 20–32)
Calcium: 9.7 mg/dL (ref 8.6–10.3)
Chloride: 102 mmol/L (ref 98–110)
Creat: 0.76 mg/dL (ref 0.70–1.30)
Globulin: 2.5 g/dL (ref 1.9–3.7)
Glucose, Bld: 101 mg/dL — ABNORMAL HIGH (ref 65–99)
Potassium: 4.5 mmol/L (ref 3.5–5.3)
Sodium: 139 mmol/L (ref 135–146)
Total Bilirubin: 0.6 mg/dL (ref 0.2–1.2)
Total Protein: 7.3 g/dL (ref 6.1–8.1)
eGFR: 109 mL/min/1.73m2

## 2024-07-27 LAB — LIPID PANEL
Cholesterol: 233 mg/dL — ABNORMAL HIGH
HDL: 62 mg/dL
LDL Cholesterol (Calc): 151 mg/dL — ABNORMAL HIGH
Non-HDL Cholesterol (Calc): 171 mg/dL — ABNORMAL HIGH
Total CHOL/HDL Ratio: 3.8 (calc)
Triglycerides: 96 mg/dL

## 2024-07-27 LAB — CBC WITH DIFFERENTIAL/PLATELET
Absolute Lymphocytes: 1607 {cells}/uL (ref 850–3900)
Absolute Monocytes: 311 {cells}/uL (ref 200–950)
Basophils Absolute: 61 {cells}/uL (ref 0–200)
Basophils Relative: 1.2 %
Eosinophils Absolute: 112 {cells}/uL (ref 15–500)
Eosinophils Relative: 2.2 %
HCT: 49.5 % (ref 39.4–51.1)
Hemoglobin: 16.3 g/dL (ref 13.2–17.1)
MCH: 31.2 pg (ref 27.0–33.0)
MCHC: 32.9 g/dL (ref 31.6–35.4)
MCV: 94.8 fL (ref 81.4–101.7)
MPV: 10.4 fL (ref 7.5–12.5)
Monocytes Relative: 6.1 %
Neutro Abs: 3009 {cells}/uL (ref 1500–7800)
Neutrophils Relative %: 59 %
Platelets: 264 Thousand/uL (ref 140–400)
RBC: 5.22 Million/uL (ref 4.20–5.80)
RDW: 12.4 % (ref 11.0–15.0)
Total Lymphocyte: 31.5 %
WBC: 5.1 Thousand/uL (ref 3.8–10.8)

## 2024-07-27 LAB — HEMOGLOBIN A1C
Hgb A1c MFr Bld: 6 % — ABNORMAL HIGH
Mean Plasma Glucose: 126 mg/dL
eAG (mmol/L): 7 mmol/L

## 2024-07-27 LAB — TSH: TSH: 1.3 m[IU]/L (ref 0.40–4.50)

## 2024-07-27 LAB — PSA: PSA: 0.35 ng/mL

## 2024-08-04 ENCOUNTER — Ambulatory Visit: Payer: Self-pay | Admitting: Family Medicine

## 2024-08-29 LAB — COLOGUARD: COLOGUARD: NEGATIVE

## 2025-07-11 ENCOUNTER — Other Ambulatory Visit

## 2025-07-18 ENCOUNTER — Encounter: Admitting: Family Medicine
